# Patient Record
Sex: Male | Born: 1945 | Race: White | Hispanic: No | Marital: Married | Smoking: Never smoker
Health system: Southern US, Community
[De-identification: ages and names within clinical notes are randomized; demographics above are authoritative.]

## PROBLEM LIST (undated history)

## (undated) DIAGNOSIS — I10 Essential (primary) hypertension: Secondary | ICD-10-CM

---

## 2019-10-14 ENCOUNTER — Other Ambulatory Visit: Payer: Self-pay

## 2019-10-14 ENCOUNTER — Encounter (HOSPITAL_COMMUNITY): Payer: Self-pay | Admitting: *Deleted

## 2019-10-14 ENCOUNTER — Emergency Department (HOSPITAL_COMMUNITY)
Admission: EM | Admit: 2019-10-14 | Discharge: 2019-10-15 | Disposition: A | Discharge status: Home / Self Care | Payer: Medicare Other | Attending: Emergency Medicine | Admitting: Emergency Medicine

## 2019-10-14 DIAGNOSIS — I10 Essential (primary) hypertension: Secondary | ICD-10-CM | POA: Insufficient documentation

## 2019-10-14 DIAGNOSIS — R519 Headache, unspecified: Secondary | ICD-10-CM | POA: Insufficient documentation

## 2019-10-14 DIAGNOSIS — H5711 Ocular pain, right eye: Secondary | ICD-10-CM | POA: Diagnosis not present

## 2019-10-14 DIAGNOSIS — E039 Hypothyroidism, unspecified: Secondary | ICD-10-CM | POA: Insufficient documentation

## 2019-10-14 DIAGNOSIS — Z79899 Other long term (current) drug therapy: Secondary | ICD-10-CM | POA: Diagnosis not present

## 2019-10-14 DIAGNOSIS — R110 Nausea: Secondary | ICD-10-CM | POA: Diagnosis not present

## 2019-10-14 HISTORY — DX: Essential (primary) hypertension: I10

## 2019-10-14 NOTE — ED Triage Notes (Signed)
The pt is c/o a fb in his rt eye for the past 1-2 hours no idea what it may be  He does not wear contacts

## 2019-10-15 ENCOUNTER — Ambulatory Visit: Payer: Medicare Other | Admitting: Physical Therapy

## 2019-10-15 ENCOUNTER — Emergency Department (HOSPITAL_COMMUNITY): Payer: Medicare Other

## 2019-10-15 DIAGNOSIS — H5711 Ocular pain, right eye: Secondary | ICD-10-CM | POA: Diagnosis not present

## 2019-10-15 LAB — CBC WITH DIFFERENTIAL/PLATELET
Abs Immature Granulocytes: 0.04 10*3/uL (ref 0.00–0.07)
Basophils Absolute: 0 10*3/uL (ref 0.0–0.1)
Basophils Relative: 0 %
Eosinophils Absolute: 0 10*3/uL (ref 0.0–0.5)
Eosinophils Relative: 0 %
HCT: 45 % (ref 39.0–52.0)
Hemoglobin: 15.3 g/dL (ref 13.0–17.0)
Immature Granulocytes: 0 %
Lymphocytes Relative: 6 %
Lymphs Abs: 0.6 10*3/uL — ABNORMAL LOW (ref 0.7–4.0)
MCH: 31.7 pg (ref 26.0–34.0)
MCHC: 34 g/dL (ref 30.0–36.0)
MCV: 93.2 fL (ref 80.0–100.0)
Monocytes Absolute: 0.3 10*3/uL (ref 0.1–1.0)
Monocytes Relative: 3 %
Neutro Abs: 8 10*3/uL — ABNORMAL HIGH (ref 1.7–7.7)
Neutrophils Relative %: 91 %
Platelets: 234 10*3/uL (ref 150–400)
RBC: 4.83 MIL/uL (ref 4.22–5.81)
RDW: 12.6 % (ref 11.5–15.5)
WBC: 8.9 10*3/uL (ref 4.0–10.5)
nRBC: 0 % (ref 0.0–0.2)

## 2019-10-15 LAB — COMPREHENSIVE METABOLIC PANEL
ALT: 20 U/L (ref 0–44)
AST: 23 U/L (ref 15–41)
Albumin: 4 g/dL (ref 3.5–5.0)
Alkaline Phosphatase: 51 U/L (ref 38–126)
Anion gap: 11 (ref 5–15)
BUN: 10 mg/dL (ref 8–23)
CO2: 21 mmol/L — ABNORMAL LOW (ref 22–32)
Calcium: 9.1 mg/dL (ref 8.9–10.3)
Chloride: 103 mmol/L (ref 98–111)
Creatinine, Ser: 1.3 mg/dL — ABNORMAL HIGH (ref 0.61–1.24)
GFR calc Af Amer: >60 mL/min (ref >60)
GFR calc non Af Amer: 53 mL/min — ABNORMAL LOW (ref >60)
Glucose, Bld: 128 mg/dL — ABNORMAL HIGH (ref 70–99)
Potassium: 4.1 mmol/L (ref 3.5–5.1)
Sodium: 135 mmol/L (ref 135–145)
Total Bilirubin: 0.7 mg/dL (ref 0.3–1.2)
Total Protein: 6.7 g/dL (ref 6.5–8.1)

## 2019-10-15 MED ORDER — DIPHENHYDRAMINE HCL 50 MG/ML IJ SOLN
25.0000 mg | Freq: Once | INTRAMUSCULAR | Status: AC
  Administered 2019-10-15: 25 mg via INTRAVENOUS
  Filled 2019-10-15: qty 1

## 2019-10-15 MED ORDER — ERYTHROMYCIN 5 MG/GM OP OINT
1.0000 "application " | TOPICAL_OINTMENT | Freq: Once | OPHTHALMIC | Status: AC
  Administered 2019-10-15: 1 via OPHTHALMIC
  Filled 2019-10-15: qty 3.5

## 2019-10-15 MED ORDER — METOCLOPRAMIDE HCL 5 MG/ML IJ SOLN
10.0000 mg | Freq: Once | INTRAMUSCULAR | Status: AC
  Administered 2019-10-15: 10 mg via INTRAVENOUS
  Filled 2019-10-15: qty 2

## 2019-10-15 MED ORDER — TIMOLOL MALEATE 0.5 % OP SOLN: 1.0000 [drp] | Freq: Two times a day (BID) | OPHTHALMIC | Status: DC

## 2019-10-15 MED ORDER — FLUORESCEIN SODIUM 1 MG OP STRP
1.0000 | ORAL_STRIP | Freq: Once | OPHTHALMIC | Status: AC
  Administered 2019-10-15: 1 via OPHTHALMIC
  Filled 2019-10-15: qty 1

## 2019-10-15 MED ORDER — ACETAMINOPHEN 500 MG PO TABS
1000.0000 mg | ORAL_TABLET | Freq: Once | ORAL | Status: AC
  Administered 2019-10-15: 1000 mg via ORAL
  Filled 2019-10-15: qty 2

## 2019-10-15 MED ORDER — TIMOLOL MALEATE 0.5 % OP SOLN
1.0000 [drp] | Freq: Once | OPHTHALMIC | Status: AC
  Administered 2019-10-15: 1 [drp] via OPHTHALMIC
  Filled 2019-10-15: qty 5

## 2019-10-15 MED ORDER — TETRACAINE HCL 0.5 % OP SOLN
2.0000 [drp] | Freq: Once | OPHTHALMIC | Status: AC
  Administered 2019-10-15: 2 [drp] via OPHTHALMIC
  Filled 2019-10-15: qty 4

## 2019-10-15 NOTE — ED Provider Notes (Signed)
[facility] EMERGENCY DEPARTMENT Provider Note   CSN: [id number] Arrival date & time: 10/14/19  2210     History Chief Complaint  Patient presents with  . Eye Problem    Jason Bradshaw is a 74 y.o. male with a past medical history of HTN not taking any medications hypothyroid who presents today for evaluation of right eye pain.  He reports that at about 9 PM he was sitting watching TV and he had sudden onset of pain in his right eye with rhinorrhea.  He feels like he has a foreign body however was not doing anything at the time to have a foreign body.  He reports his vision was originally blurry however that has improved.  He reports that after about 2 hours in the ER he developed a headache and nausea.  He reports he does not get headaches frequently.   HPI     Past Medical History:  Diagnosis Date  . Hypertension     There are no problems to display for this patient.   History reviewed. No pertinent surgical history.     No family history on file.  Social History   Tobacco Use  . Smoking status: Never Smoker  . Smokeless tobacco: Never Used  Substance Use Topics  . Alcohol use: Yes  . Drug use: Not on file    Home Medications Prior to Admission medications   Medication Sig Start Date End Date Taking? Authorizing Provider  acetaminophen (TYLENOL) 500 MG tablet Take 2 tablets by mouth every 6 (six) hours as needed for pain.   Yes [provider]  azelastine (ASTELIN) 0.1 % nasal spray Place 2 sprays into both nostrils 2 (two) times daily as needed for congestion or allergies. 09/08/19  Yes [provider]  cetirizine (ZYRTEC) 10 MG tablet Take 1 tablet by mouth daily.   Yes [provider]  Eszopiclone 3 MG TABS Take 1 tablet by mouth at bedtime. 05/07/11  Yes [provider]  fluticasone (FLONASE) 50 MCG/ACT nasal spray Place 2 sprays into both nostrils daily as needed for allergies. 03/31/13  Yes [provider]  levothyroxine (SYNTHROID) 25 MCG tablet Take 1 tablet by mouth daily. 02/02/19  Yes [provider]  liothyronine (CYTOMEL) 5 MCG tablet Take 3 tablets by mouth daily. 06/24/17  Yes [provider]  mirabegron ER (MYRBETRIQ) 50 MG TB24 tablet Take 1 tablet by mouth daily as needed. 04/29/17  Yes [provider]  tamsulosin (FLOMAX) 0.4 MG CAPS capsule Take 2 capsules by mouth daily. 05/21/11  Yes [provider]    Allergies    Penicillins  Review of Systems   Review of Systems  Constitutional: Negative for chills and fever.  Eyes: Positive for pain, redness and visual disturbance. Negative for photophobia, discharge and itching.  Respiratory: Negative for cough and shortness of breath.   Gastrointestinal: Positive for nausea.  Neurological: Positive for headaches.  All other systems reviewed and are negative.   Physical Exam Updated Vital Signs BP (!) 189/93   Pulse 63   Temp 98.1 F (36.7 C)   Resp 16   Ht 5\' 11"  (1.803 m)   Wt 88 kg   SpO2 98%   BMI 27.06 kg/m   Physical Exam Vitals and nursing note reviewed.  Constitutional:      General: He is not in acute distress.    Appearance: He is well-developed. He is not diaphoretic.  HENT:     Head: Normocephalic  and atraumatic.  Eyes:     General: Lids are everted, no foreign bodies appreciated. Vision grossly intact. Gaze aligned appropriately. No scleral icterus.       Right eye: No discharge.        Left eye: No discharge.     Intraocular pressure: Measurements were taken using a handheld tonometer.    Extraocular Movements: Extraocular movements intact.     Conjunctiva/sclera: Conjunctivae normal.     Pupils: Pupils are equal, round, and reactive to light.     Comments: Mild erythema over the right upper lid.   Right eye tonopen pressures between 20-40, left eye pressures 13-15.    Cardiovascular:     Rate and Rhythm: Normal rate and regular rhythm.  Pulmonary:      Effort: Pulmonary effort is normal. No respiratory distress.     Breath sounds: No stridor.  Abdominal:     General: There is no distension.  Musculoskeletal:        General: No deformity.     Cervical back: Normal range of motion.  Skin:    General: Skin is warm and dry.  Neurological:     Mental Status: He is alert.     Motor: No abnormal muscle tone.  Psychiatric:        Behavior: Behavior normal.     ED Results / Procedures / Treatments   Labs (all labs ordered are listed, but only abnormal results are displayed) Labs Reviewed  CBC WITH DIFFERENTIAL/PLATELET - Abnormal; Notable for the following components:      Result Value   Neutro Abs 8.0 (*)    Lymphs Abs 0.6 (*)    All other components within normal limits  COMPREHENSIVE METABOLIC PANEL - Abnormal; Notable for the following components:   CO2 21 (*)    Glucose, Bld 128 (*)    Creatinine, Ser 1.30 (*)    GFR calc non Af Amer 53 (*)    All other components within normal limits    EKG EKG Interpretation  Date/Time:  Thursday October 15 2019 04:00:18 EDT Ventricular Rate:  67 PR Interval:    QRS Duration: 99 QT Interval:  414 QTC Calculation: 437 R Axis:   -23 Text Interpretation: Sinus rhythm Borderline left axis deviation Abnormal R-wave progression, early transition No previous ECGs available Confirmed by Zadie Rhine ([id number]) on 10/15/2019 4:03:15 AM   Radiology CT Head Wo Contrast  Result Date: 10/15/2019 CLINICAL DATA:  Headache, right eye pain EXAM: CT HEAD WITHOUT CONTRAST TECHNIQUE: Contiguous axial images were obtained from the base of the skull through the vertex without intravenous contrast. COMPARISON:  None. FINDINGS: Brain: No evidence of acute territorial infarction, hemorrhage, hydrocephalus,extra-axial collection or mass lesion/mass effect. There is dilatation the ventricles and sulci consistent with age-related atrophy. Low-attenuation changes in the deep white matter consistent with small  vessel ischemia. Vascular: No hyperdense vessel or unexpected calcification. Skull: The skull is intact. No fracture or focal lesion identified. Sinuses/Orbits: The visualized paranasal sinuses and mastoid air cells are clear. The orbits and globes intact. Other: None IMPRESSION: No acute intracranial abnormality. 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   On: 10/15/2019 03:48    Procedures Procedures (including critical care time)  Medications Ordered in ED Medications  fluorescein ophthalmic strip 1 strip (1 strip Both Eyes Given 10/15/19 0204)  tetracaine (PONTOCAINE) 0.5 % ophthalmic solution 2 drop (2 drops Both Eyes Given  10/15/19 0205)  metoCLOPramide (REGLAN) injection 10 mg (10 mg Intravenous Given 10/15/19 0319)  diphenhydrAMINE (BENADRYL) injection 25 mg (25 mg Intravenous Given 10/15/19 0319)  erythromycin ophthalmic ointment 1 application (1 application Right Eye Given 10/15/19 0434)  acetaminophen (TYLENOL) tablet 1,000 mg (1,000 mg Oral Given 10/15/19 0703)  timolol (TIMOPTIC) 0.5 % ophthalmic solution 1 drop (1 drop Right Eye Given 10/15/19 0703)    ED Course  I have reviewed the triage vital signs and the nursing notes.  Pertinent labs & imaging results that were available during my care of the patient were reviewed by me and considered in my medical decision making (see chart for details).  Clinical Course as of Oct 14 725  Thu Oct 15, 2019  0340 I spoke with Dr. Deatra James of ophthalmology he recommends timolol BID drops and erythromycin ointment.    [EH]    Clinical Course User Index [EH] Cristina Gong, PA-C   MDM Rules/Calculators/A&P                         Burlin Mcnair is a 74 year old man who presents today for evaluation of foreign body sensation of the right eye.  While in the waiting room he developed headache and nausea.  On exam he does not have any  fluorescein uptake in the right eye.  No foreign body in his eye pain was fully relieved with tetracaine drops.  Vision is grossly intact.  Given his headache and nausea CT head Noncon was obtained.  Given that it was within 6 hours of his headache starting if he had any SAH would have expected it to show.  He was markedly hypertensive while in the emergency room with systolic pressures in the 240s.  He is not taking any medications.  CBC is unremarkable.  CMP shows slightly elevated creatinine, no prior for comparison.  In the ER he was treated with Benadryl and Reglan for his migraine.  This resolved his nausea.  Given that he is neuro intact with a normal head CT low suspicion for serious intracranial pathology at this time.  I suspect that his hypertension is being driven by his pain, however we did discuss the importance of outpatient follow-up as he most likely has baseline high blood pressure needs to be on medications.  I spoke with Dr. Randon Goldsmith on-call for ophthalmology who recommends timolol drops twice daily, and erythromycin ointment and have patient follow-up in the office when they open in the morning.  He is aware that the pressures appear to be elevated in the right eye which is painful  The  information is given to patient.  This patient was seen  As a shared visit with Dr. Bebe Shaggy.    Return precautions were discussed with patient who states their understanding.  At the time of discharge patient denied any unaddressed complaints or concerns.  Patient is agreeable for discharge home.  Note: Portions of this report may have been transcribed using voice recognition software. Every effort was made to ensure accuracy; however, inadvertent computerized transcription errors may be present   Final Clinical Impression(s) / ED Diagnoses Final diagnoses:  Acute right eye pain  Hypertension, unspecified type    Rx / DC Orders ED Discharge Orders    None       Norman Clay 10/15/19 9767    Zadie Rhine, MD 10/16/19 [phone]

## 2019-10-15 NOTE — Discharge Instructions (Addendum)
To use your eye drops please put one drop in your right eye twice a day.    Please put a 1/2 inch strip of the eye ointment (erythromycin) in your right eye 4 times a day.

## 2019-10-15 NOTE — ED Notes (Signed)
Pt had bilateral cataract surgery long lens in right eye and short lens in left

## 2019-12-15 ENCOUNTER — Ambulatory Visit: Payer: Medicare Other | Attending: Adult Reconstructive Orthopaedic Surgery

## 2019-12-15 ENCOUNTER — Other Ambulatory Visit: Payer: Self-pay

## 2019-12-15 DIAGNOSIS — M545 Low back pain: Secondary | ICD-10-CM | POA: Diagnosis not present

## 2019-12-15 DIAGNOSIS — R2689 Other abnormalities of gait and mobility: Secondary | ICD-10-CM | POA: Diagnosis present

## 2019-12-15 DIAGNOSIS — M25551 Pain in right hip: Secondary | ICD-10-CM

## 2019-12-15 DIAGNOSIS — G8929 Other chronic pain: Secondary | ICD-10-CM | POA: Insufficient documentation

## 2019-12-15 DIAGNOSIS — R252 Cramp and spasm: Secondary | ICD-10-CM | POA: Insufficient documentation

## 2019-12-15 NOTE — Therapy (Signed)
[facility] [address] [place], [place], [address] Phone: [phone]   Fax:  [phone]  Physical Therapy Evaluation  Patient Details  Name: Jason Bradshaw MRN: [id number] Date of Birth: [birth year] Referring Provider (PT): Larry Sierras   Encounter Date: 12/15/2019   PT End of Session - 12/15/19 1213    Visit Number 1    Date for PT Re-Evaluation 02/09/20    Authorization Type Medicare    PT Start Time 1101    PT Stop Time 1138    PT Time Calculation (min) 37 min    Activity Tolerance Patient tolerated treatment well    Behavior During Therapy [facility] for tasks assessed/performed           Past Medical History:  Diagnosis Date  . Hypertension     History reviewed. No pertinent surgical history.  There were no vitals filed for this visit.    Subjective Assessment - 12/15/19 1105    Subjective Pt presents to PT with Lt sided low back pain of a chronic nature (25 year history) and onset of Rt hip pain that began 6 months ago.  MD advised pt to continue to do exercises issued after hip replacement surgery.    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 painful    How long can you walk comfortably? painful    Diagnostic tests x-ray: DDD in lumbar spine.  Rt hip x-ray was normal.    Patient Stated Goals reduce Rt hip pain    Currently in Pain? Yes    Pain Score 1    up to 4-5/10 in Rt hip.  No LBP today   Pain Location Hip   greater trochanteric bursa and radiates to the Rt knee   Pain Orientation Right    Pain Descriptors / Indicators Aching    Pain Type Chronic pain    Pain Radiating Towards Rt knee    Pain Onset More than a month ago    Pain Frequency Intermittent    Aggravating Factors  sleep at night (waking 4-5x/night)    Pain Relieving Factors gets better as the day progresses, tylenol    Effect of Pain on Daily Activities waking at night              [facility]  PT Assessment - 12/15/19 0001      Assessment   Medical Diagnosis presence of Rt artificial hip joint, Lumbago with sciatica    Referring Provider (PT) Debbora Dus, Joseph    Onset Date/Surgical Date 06/17/19   Rt LE pain, LBP is chronic    Next MD Visit none    Prior Therapy none      Precautions   Precautions None      Restrictions   Weight Bearing Restrictions No      Balance Screen   Has the patient fallen in the past 6 months Yes    How many times? 1   1 month ago- dog pulled    Has the patient had a decrease in activity level because of a fear of falling?  No    Is the patient reluctant to leave their home because of a fear of falling?  No      Home Environment   Living Environment Private residence    Living Arrangements Spouse/significant other    Type of Home Apartment    Home Access Stairs to enter;Elevator    Home Layout One level  Prior Function   Level of Independence Independent    Vocation Retired    Education officer, environmentalLeisure golf      Cognition   Overall Cognitive Status Within Functional Limits for tasks assessed      Observation/Other Assessments   Focus on Therapeutic Outcomes (FOTO)  64% limitation      Posture/Postural Control   Posture/Postural Control Postural limitations    Postural Limitations Rounded Shoulders;Forward head;Flexed trunk      ROM / Strength   AROM / PROM / Strength AROM;PROM;Strength      AROM   Overall AROM  Deficits    Overall AROM Comments Rt hip flexibility limited by 25% vs the Lt.  Lumbar A/ROM is limited by 25% without pain.      PROM   Overall PROM  Deficits    Overall PROM Comments Bil hamstrings limited by 50%.  Rt hip flexibility limited by 25% vs the Lt.        Strength   Overall Strength Deficits    Overall Strength Comments Bil knee and ankle strength is 5/5.  Rt hip abduction 4/5, flexion 4+/5, extension 4/5.  Lt hip 4+/5 throughout.      Palpation   Spinal mobility reduced PA mobility without pain    Palpation comment  No palpable tenderness in the lumbar spine today.  Rt lateral hamstring with trigger points and pain with palpation.  No tenderness over Rt trochanteric bursa.      Transfers   Transfers Independent with all Transfers      Ambulation/Gait   Ambulation/Gait Yes    Gait Pattern Antalgic                      Objective measurements completed on examination: See above findings.               PT Education - 12/15/19 1132    Education Details Access Code: [email or web address]    Person(s) Educated Patient    Methods Explanation;Demonstration;Handout    Comprehension Verbalized understanding;Returned demonstration            PT Short Term Goals - 12/15/19 1052      PT SHORT TERM GOAL #1   Title be independent in initial HEP    Time 4    Period Weeks    Status New    Target Date 01/12/20      PT SHORT TERM GOAL #2   Title reduce Rt LE pain to report waking < or = to 2-3x/night with pain    Time 4    Period Weeks    Status New    Target Date 01/12/20             PT Long Term Goals - 12/15/19 1052      PT LONG TERM GOAL #1   Title be independent in advanced HEP    Time 8    Period Weeks    Status New    Target Date 02/09/20      PT LONG TERM GOAL #2   Title reduce FOTO to < or = to 45% limitation    Time 8    Period Weeks    Status New    Target Date 02/09/20      PT LONG TERM GOAL #3   Title report a reduction in Rt LE pain to allow for waking < or = to 1x/night with pain    Time 8    Period Weeks    Status  New    Target Date 02/09/20      PT LONG TERM GOAL #4   Title improve Rt hip A/ROM to put on shoes and socks on the Rt with 70% increased ease    Time 8    Period Weeks    Status New    Target Date 02/09/20      PT LONG TERM GOAL #5   Title report a 70% reduction in Rt LE pain with standing and walking    Time 8    Period Weeks    Status New    Target Date 02/09/20      Additional Long Term Goals   Additional Long Term Goals Yes        PT LONG TERM GOAL #6   Title demonstrate symmetry with gait on level surface due to reduced Rt LE pain    Time 8    Period Weeks    Status New    Target Date 02/09/20                  Plan - 12/15/19 1147    Clinical Impression Statement Pt presents to PT with chronic LPB with 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  MD did recent x-rays and indicated that DDD is causing Rt LE pain.  Pt rates Rt hip pain as 1-4/10 with negotiating steps and putting on socks and shoes due to limited A/ROM of the Rt hip.  Pt is waking 4-5x/night with Rt LE pain.  Pt with reduced Rt hip A/ROM and flexibility vs the Lt.  Pt with localized palpable tenderness over Rt lateral hamstring.  Pt with reduced Rt hip strength and core strength throughout.  Pt will benefit from skilled PT to advance HEP for Rt hip flexibility and hip and core progression.    Personal Factors and Comorbidities Comorbidity 2    Comorbidities Rt THA, chronic LBP, time since onset,    Examination-Activity Limitations Sleep;Stairs    Examination-Participation Restrictions Shop;Meal Prep    Stability/Clinical Decision Making Stable/Uncomplicated    Clinical Decision Making Moderate    Rehab Potential Good    PT Frequency 1x / week   likely every other week   PT Duration 8 weeks    PT Treatment/Interventions ADLs/Self Care Home Management;Cryotherapy;Electrical Stimulation;Moist Heat;Iontophoresis 4mg /ml Dexamethasone;Gait training;Stair training;Functional mobility training;Therapeutic activities;Therapeutic exercise;Balance training;Neuromuscular re-education;Manual techniques;Patient/family education;Passive range of motion;Dry needling;Taping    PT Next Visit Plan review HEP, teach mobilization to Rt lateral hamstring, add TA activation and core exercises    PT Home Exercise Plan Access Code:    Consulted and Agree with Plan of Care Patient           Patient will benefit from skilled  therapeutic intervention in order to improve the following deficits and impairments:  Abnormal gait, Decreased activity tolerance, Decreased strength, Postural dysfunction, Impaired flexibility, Pain, Increased muscle spasms, Decreased endurance, Difficulty walking, Decreased range of motion  Visit Diagnosis: Chronic bilateral low back pain without sciatica - Plan: PT plan of care cert/re-cert  Cramp and spasm - Plan: PT plan of care cert/re-cert  Other abnormalities of gait and mobility - Plan: PT plan of care cert/re-cert  Pain in right hip - Plan: PT plan of care cert/re-cert     Problem List There are no problems to display for this patient.    [id number], PT 12/15/19 12:23 PM  [facility] [address],  [address] [place], [place], [address] Phone: [phone]   Fax:  [phone]  Name: Ran Tullis MRN: [id number] Date of Birth: December 09, 1945

## 2019-12-15 NOTE — Patient Instructions (Signed)
Access Code: [email or web address] URL: [email or web address] Date: 12/15/2019 Prepared by: Tresa Endo  Exercises Hooklying Single Knee to Chest Stretch - 2 x daily - 7 x weekly - 1 sets - 3 reps - 20 hold Hooklying Single Knee to Chest Stretch - 2 x daily - 7 x weekly - 1 sets - 3 reps - 20 hold Seated Hamstring Stretch - 2 x daily - 7 x weekly - 1 sets - 3 reps - 20 hold Seated Figure 4 Piriformis Stretch - 2 x daily - 7 x weekly - 1 sets - 3 reps - 20 hold Seated Transversus Abdominis Bracing - 4 x daily - 7 x weekly - 1 sets - 10 reps - 5 hold

## 2019-12-30 ENCOUNTER — Ambulatory Visit: Payer: Medicare Other | Attending: Adult Reconstructive Orthopaedic Surgery | Admitting: Physical Therapy

## 2019-12-30 ENCOUNTER — Other Ambulatory Visit: Payer: Self-pay

## 2019-12-30 ENCOUNTER — Encounter: Payer: Self-pay | Admitting: Physical Therapy

## 2019-12-30 DIAGNOSIS — M25551 Pain in right hip: Secondary | ICD-10-CM | POA: Insufficient documentation

## 2019-12-30 DIAGNOSIS — M545 Low back pain: Secondary | ICD-10-CM | POA: Diagnosis present

## 2019-12-30 DIAGNOSIS — R2689 Other abnormalities of gait and mobility: Secondary | ICD-10-CM | POA: Insufficient documentation

## 2019-12-30 DIAGNOSIS — G8929 Other chronic pain: Secondary | ICD-10-CM | POA: Insufficient documentation

## 2019-12-30 DIAGNOSIS — R252 Cramp and spasm: Secondary | ICD-10-CM | POA: Diagnosis present

## 2019-12-30 NOTE — Therapy (Signed)
[facility] [address] [place], [place], [address] Phone: [phone]   Fax:  [phone]  Physical Therapy Treatment  Patient Details  Name: Jason Bradshaw MRN: [id number] Date of Birth: Jul 01, 1945 Referring Provider (PT): Larry Sierras   Encounter Date: 12/30/2019   PT End of Session - 12/30/19 1223    Visit Number 2    Date for PT Re-Evaluation 02/09/20    Authorization Type Medicare    PT Start Time 1145    PT Stop Time 1223    PT Time Calculation (min) 38 min    Activity Tolerance Patient tolerated treatment well    Behavior During Therapy [facility] for tasks assessed/performed           Past Medical History:  Diagnosis Date  . Hypertension     History reviewed. No pertinent surgical history.  There were no vitals filed for this visit.   Subjective Assessment - 12/30/19 1148    Subjective I have been on vacation and have not been doing my exercises very much.    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 Yes    Pain Score 3     Pain Location Pelvis    Pain Orientation Lower                             [facility] Adult PT Treatment/Exercise - 12/30/19 0001      Lumbar Exercises: Stretches   Active Hamstring Stretch Right;Left;2 reps;20 seconds    Single Knee to Chest Stretch Right;Left;2 reps;20 seconds    Single Knee to Chest Stretch Limitations cross over stretch Bil 2x 20 sec     Figure 4 Stretch 2 reps;20 seconds;With overpressure      Lumbar Exercises: Aerobic   Nustep L2 x 10 min end of session      Lumbar Exercises: Supine   Ab Set --   TA contraction 6x   Glut Set 5 reps;3 seconds    Glut Set Limitations ball squeeze with glute squeeze    Clam --   unilateral 10x   Bent Knee Raise 10 reps    Bridge Limitations Attempted but could not do without cramping in hamstring                    PT Short Term Goals - 12/15/19 1052      PT SHORT TERM  GOAL #1   Title be independent in initial HEP    Time 4    Period Weeks    Status New    Target Date 01/12/20      PT SHORT TERM GOAL #2   Title reduce Rt LE pain to report waking < or = to 2-3x/night with pain    Time 4    Period Weeks    Status New    Target Date 01/12/20             PT Long Term Goals - 12/15/19 1052      PT LONG TERM GOAL #1   Title be independent in advanced HEP    Time 8    Period Weeks    Status New    Target Date 02/09/20      PT LONG TERM GOAL #2   Title reduce FOTO to < or = to 45% limitation    Time 8    Period Weeks    Status New  Target Date 02/09/20      PT LONG TERM GOAL #3   Title report a reduction in Rt LE pain to allow for waking < or = to 1x/night with pain    Time 8    Period Weeks    Status New    Target Date 02/09/20      PT LONG TERM GOAL #4   Title improve Rt hip A/ROM to put on shoes and socks on the Rt with 70% increased ease    Time 8    Period Weeks    Status New    Target Date 02/09/20      PT LONG TERM GOAL #5   Title report a 70% reduction in Rt LE pain with standing and walking    Time 8    Period Weeks    Status New    Target Date 02/09/20      Additional Long Term Goals   Additional Long Term Goals Yes      PT LONG TERM GOAL #6   Title demonstrate symmetry with gait on level surface due to reduced Rt LE pain    Time 8    Period Weeks    Status New    Target Date 02/09/20                 Plan - 12/30/19 1150    Clinical Impression Statement Pt presents with mild back pain today. he has been on vacation and not been doing his HEP very much. After a review of HEP pt did require some re-education on technique. New HEP was not given today to give pt a few days to get back into his routine at home.    Personal Factors and Comorbidities Comorbidity 2    Comorbidities Rt THA, chronic LBP, time since onset,    Examination-Activity Limitations Sleep;Stairs    Examination-Participation  Restrictions Shop;Meal Prep    Stability/Clinical Decision Making Stable/Uncomplicated    Rehab Potential Good    PT Frequency 1x / week    PT Duration 8 weeks    PT Treatment/Interventions ADLs/Self Care Home Management;Cryotherapy;Electrical Stimulation;Moist Heat;Iontophoresis 4mg /ml Dexamethasone;Gait training;Stair training;Functional mobility training;Therapeutic activities;Therapeutic exercise;Balance training;Neuromuscular re-education;Manual techniques;Patient/family education;Passive range of motion;Dry needling;Taping    PT Next Visit Plan See if pt doing his stretches daily, especially the hamstrings. Pt may be ready to add to HEP and progress his core & hip strengthening exercises.    PT Home Exercise Plan Access Code:    Consulted and Agree with Plan of Care Patient           Patient will benefit from skilled therapeutic intervention in order to improve the following deficits and impairments:  Abnormal gait, Decreased activity tolerance, Decreased strength, Postural dysfunction, Impaired flexibility, Pain, Increased muscle spasms, Decreased endurance, Difficulty walking, Decreased range of motion  Visit Diagnosis: Chronic bilateral low back pain without sciatica  Cramp and spasm  Other abnormalities of gait and mobility  Pain in right hip     Problem List There are no problems to display for this patient.   Fredick Schlosser, PTA 12/30/2019, 12:25 PM  [facility] [address] [place], [place], [place] Phone: [phone]   Fax:  [phone]  Name: Jason Bradshaw MRN: [name] Date of Birth: Sep 10, 1945

## 2020-01-06 ENCOUNTER — Other Ambulatory Visit: Payer: Self-pay

## 2020-01-06 ENCOUNTER — Ambulatory Visit: Payer: Medicare Other | Admitting: Physical Therapy

## 2020-01-06 ENCOUNTER — Encounter: Payer: Self-pay | Admitting: Physical Therapy

## 2020-01-06 DIAGNOSIS — R252 Cramp and spasm: Secondary | ICD-10-CM

## 2020-01-06 DIAGNOSIS — M25551 Pain in right hip: Secondary | ICD-10-CM

## 2020-01-06 DIAGNOSIS — R2689 Other abnormalities of gait and mobility: Secondary | ICD-10-CM

## 2020-01-06 DIAGNOSIS — M545 Low back pain, unspecified: Secondary | ICD-10-CM

## 2020-01-06 NOTE — Patient Instructions (Signed)
Access Code: [email or web address] URL: [email or web address] Date: 01/06/2020 Prepared by: Raynelle Fanning  Exercises Hooklying Single Knee to Chest Stretch - 2 x daily - 7 x weekly - 1 sets - 3 reps - 20 hold Hooklying Single Knee to Chest Stretch - 2 x daily - 7 x weekly - 1 sets - 3 reps - 20 hold Seated Hamstring Stretch - 2 x daily - 7 x weekly - 1 sets - 3 reps - 20 hold Seated Figure 4 Piriformis Stretch - 2 x daily - 7 x weekly - 1 sets - 3 reps - 20 hold Seated Transversus Abdominis Bracing - 4 x daily - 7 x weekly - 1 sets - 10 reps - 5 hold Mini Squat/Good Morning - 2 x daily - 3 x weekly - 1 sets - 10 reps Supine Active Straight Leg Raise - 1 x daily - 3 x weekly - 3 sets - 10 reps Sidelying Hip Abduction - 1 x daily - 3 x weekly - 3 sets - 10 reps Prone Hip Extension - 1 x daily - 3 x weekly - 3 sets - 10 reps

## 2020-01-06 NOTE — Therapy (Signed)
[facility] [address] [place], [place], [address] Phone: [phone]   Fax:  [phone]  Physical Therapy Treatment  Patient Details  Name: Jason Bradshaw MRN: [id number] Date of Birth: 10/18/1945 Referring Provider (PT): Larry Sierras   Encounter Date: 01/06/2020   PT End of Session - 01/06/20 0935    Visit Number 3    Date for PT Re-Evaluation 02/09/20    Authorization Type Medicare    PT Start Time 0928    PT Stop Time 1013    PT Time Calculation (min) 45 min    Activity Tolerance Patient tolerated treatment well    Behavior During Therapy [facility] for tasks assessed/performed           Past Medical History:  Diagnosis Date  . Hypertension     History reviewed. No pertinent surgical history.  There were no vitals filed for this visit.   Subjective Assessment - 01/06/20 0935    Subjective I've been fair with my HEP.    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  Rt hip x-ray was normal.    Patient Stated Goals reduce Rt hip pain    Currently in Pain? Yes    Pain Score 2     Pain Location Pelvis    Pain Orientation Lower    Pain Descriptors / Indicators Aching    Pain Type Chronic pain                             [facility] Adult PT Treatment/Exercise - 01/06/20 0001      Lumbar Exercises: Stretches   Active Hamstring Stretch Right;Left;2 reps;30 seconds    Figure 4 Stretch 2 reps;30 seconds;Seated;With overpressure      Lumbar Exercises: Aerobic   Nustep L2 x 7 min      Lumbar Exercises: Machines for Strengthening   Leg Press 50# x 20 seat 6      Lumbar Exercises: Standing   Functional Squats 20 reps    Functional Squats Limitations good mornings 2x10; also squat with bil shoulder ext 2x10 sec each; with OH lift x 10       Lumbar Exercises: Supine   Straight Leg Raise 20 reps    Straight Leg Raises Limitations bil cues to avoid  quad lag       Lumbar Exercises: Sidelying   Hip Abduction Both;20 reps      Lumbar Exercises: Prone   Straight Leg Raise 10 reps    Other Prone Lumbar Exercises pelvic press 5x5sec;                   PT Education - 01/06/20 1322    Education Details HEP progressed    Person(s) Educated Patient    Methods Explanation;Demonstration;Handout    Comprehension Verbalized understanding;Returned demonstration            PT Short Term Goals - 12/15/19 1052      PT SHORT TERM GOAL #1   Title be independent in initial HEP    Time 4    Period Weeks    Status New    Target Date 01/12/20      PT SHORT TERM GOAL #2   Title reduce Rt LE pain to report waking < or = to 2-3x/night with pain    Time 4    Period Weeks    Status New  Target Date 01/12/20             PT Long Term Goals - 12/15/19 1052      PT LONG TERM GOAL #1   Title be independent in advanced HEP    Time 8    Period Weeks    Status New    Target Date 02/09/20      PT LONG TERM GOAL #2   Title reduce FOTO to < or = to 45% limitation    Time 8    Period Weeks    Status New    Target Date 02/09/20      PT LONG TERM GOAL #3   Title report a reduction in Rt LE pain to allow for waking < or = to 1x/night with pain    Time 8    Period Weeks    Status New    Target Date 02/09/20      PT LONG TERM GOAL #4   Title improve Rt hip A/ROM to put on shoes and socks on the Rt with 70% increased ease    Time 8    Period Weeks    Status New    Target Date 02/09/20      PT LONG TERM GOAL #5   Title report a 70% reduction in Rt LE pain with standing and walking    Time 8    Period Weeks    Status New    Target Date 02/09/20      Additional Long Term Goals   Additional Long Term Goals Yes      PT LONG TERM GOAL #6   Title demonstrate symmetry with gait on level surface due to reduced Rt LE pain    Time 8    Period Weeks    Status New    Target Date 02/09/20                 Plan -  01/06/20 1319    Clinical Impression Statement Patient did well with TE today demonstrating good form with squats and low back strengthening. He reports he's doing "fair" with his HEP. Some quad lag on right with SLR. LTGs are ongoing.    Comorbidities Rt THA, chronic LBP, time since onset,    Examination-Activity Limitations Sleep;Stairs    Examination-Participation Restrictions Shop;Meal Prep    PT Frequency 1x / week    PT Duration 8 weeks    PT Treatment/Interventions ADLs/Self Care Home Management;Cryotherapy;Electrical Stimulation;Moist Heat;Iontophoresis 4mg /ml Dexamethasone;Gait training;Stair training;Functional mobility training;Therapeutic activities;Therapeutic exercise;Balance training;Neuromuscular re-education;Manual techniques;Patient/family education;Passive range of motion;Dry needling;Taping    PT Next Visit Plan Monitor compliance with HEP. Progress his core & hip strengthening exercises.    PT Home Exercise Plan Access Code:    Consulted and Agree with Plan of Care Patient           Patient will benefit from skilled therapeutic intervention in order to improve the following deficits and impairments:  Abnormal gait, Decreased activity tolerance, Decreased strength, Postural dysfunction, Impaired flexibility, Pain, Increased muscle spasms, Decreased endurance, Difficulty walking, Decreased range of motion  Visit Diagnosis: Chronic bilateral low back pain without sciatica  Cramp and spasm  Other abnormalities of gait and mobility  Pain in right hip     Problem List There are no problems to display for this patient.   [id number] Naitik Hermann PT 01/06/2020, 1:25 PM  [facility] [address], [address] [place], [place], [place] Phone: [phone]  Fax:  [phone]  Name: Jason Bradshaw MRN: [id number] Date of Birth: Sep 28, 1945

## 2020-01-14 ENCOUNTER — Other Ambulatory Visit: Payer: Self-pay

## 2020-01-14 ENCOUNTER — Ambulatory Visit: Payer: Medicare Other

## 2020-01-14 DIAGNOSIS — M25551 Pain in right hip: Secondary | ICD-10-CM

## 2020-01-14 DIAGNOSIS — R2689 Other abnormalities of gait and mobility: Secondary | ICD-10-CM

## 2020-01-14 DIAGNOSIS — R252 Cramp and spasm: Secondary | ICD-10-CM

## 2020-01-14 DIAGNOSIS — M545 Low back pain: Secondary | ICD-10-CM | POA: Diagnosis not present

## 2020-01-14 DIAGNOSIS — G8929 Other chronic pain: Secondary | ICD-10-CM

## 2020-01-14 NOTE — Therapy (Addendum)
[facility] [address], [place], [place], [address] Phone: [phone]   Fax:  [phone]  Physical Therapy Treatment  Patient Details  Name: Quentavious Rittenhouse MRN: [id number] Date of Birth: [birth year] Referring Provider (PT): Alvino Chapel   Encounter Date: 01/14/2020   PT End of Session - 01/14/20 1140    Visit Number 4    Date for PT Re-Evaluation 02/09/20    PT Start Time 1058    PT Stop Time 1140    PT Time Calculation (min) 42 min    Activity Tolerance Patient tolerated treatment well    Behavior During Therapy [facility] for tasks assessed/performed           Past Medical History:  Diagnosis Date  . Hypertension     History reviewed. No pertinent surgical history.  There were no vitals filed for this visit.   Subjective Assessment - 01/14/20 1050    Subjective I have been doing good.              [facility] PT Assessment - 01/14/20 0001      Assessment   Medical Diagnosis presence of Rt artificial hip joint, Lumbago with sciatica    Referring Provider (PT) Moskal, Broadus John      Observation/Other Assessments   Focus on Therapeutic Outcomes (FOTO)  31% limitation                         [facility] Adult PT Treatment/Exercise - 01/14/20 0001      Lumbar Exercises: Stretches   Active Hamstring Stretch Right;Left;2 reps;30 seconds    Figure 4 Stretch 2 reps;30 seconds;Seated;With overpressure      Lumbar Exercises: Aerobic   Nustep L2 x 10 min      Lumbar Exercises: Standing   Functional Squats 20 reps    Other Standing Lumbar Exercises walking in reverse: 30# with abdominal bracing x 10      Lumbar Exercises: Supine   Bridge 10 reps;5 seconds      Lumbar Exercises: Sidelying   Hip Abduction Both;20 reps      Lumbar Exercises: Prone   Straight Leg Raise 10 reps                  PT Education - 01/14/20 1124    Education Details Access Code: [email or web address]    Person(s)  Educated Patient    Methods Explanation;Demonstration    Comprehension Verbalized understanding;Returned demonstration            PT Short Term Goals - 01/14/20 1058      PT SHORT TERM GOAL #1   Title be independent in initial HEP    Status Achieved      PT SHORT TERM GOAL #2   Title reduce Rt LE pain to report waking < or = to 2-3x/night with pain    Status Achieved             PT Long Term Goals - 01/14/20 1059      PT LONG TERM GOAL #1   Title be independent in advanced HEP    Time 8    Period Weeks    Status On-going      PT LONG TERM GOAL #2   Title reduce FOTO to < or = to 45% limitation    Baseline 31% limitation    Period --    Status Achieved      PT LONG TERM GOAL #3  Title report a reduction in Rt LE pain to allow for waking < or = to 1x/night with pain    Baseline waking 1-2x/night    Time 8    Period Weeks    Status On-going      PT LONG TERM GOAL #4   Title improve Rt hip A/ROM to put on shoes and socks on the Rt with 70% increased ease    Status Partially Met      PT LONG TERM GOAL #5   Title report a 70% reduction in Rt LE pain with standing and walking    Baseline 75-80% overall reduction    Status Achieved      PT LONG TERM GOAL #6   Title demonstrate symmetry with gait on level surface due to reduced Rt LE pain    Status Achieved                 Plan - 01/14/20 1130    Clinical Impression Statement Pt reports 75-80% overall improvement in symptoms since the start of care.  He is sleeping better with waking 1 time or less each night.  Pt performed exercises and demonstrated core and hip instability and required intermittent verbal cues for abdominal bracing.  Pillow was added under abdomen with prone hip extension due to LBP with this activity.  Pt demonstrated hip weakness with this and PT advised pt to reduce repetitions to help with pain.  PT also issued bridge to work on glute strength as this was less painful for patient.  Pt  will continue to benefit from skilled PT to address strength, flexibility and functional mobility.    PT Frequency 1x / week    PT Duration 8 weeks    PT Treatment/Interventions ADLs/Self Care Home Management;Cryotherapy;Electrical Stimulation;Moist Heat;Iontophoresis 22m/ml Dexamethasone;Gait training;Stair training;Functional mobility training;Therapeutic activities;Therapeutic exercise;Balance training;Neuromuscular re-education;Manual techniques;Patient/family education;Passive range of motion;Dry needling;Taping    PT Next Visit Plan Pt will be placed on hold until 02/09/20 and return if needed.  He will work on HONEOKfor strength and flexibility gains    PT Home Exercise Plan Access Code: [email or web address]   Consulted and Agree with Plan of Care Patient           Patient will benefit from skilled therapeutic intervention in order to improve the following deficits and impairments:  Abnormal gait, Decreased activity tolerance, Decreased strength, Postural dysfunction, Impaired flexibility, Pain, Increased muscle spasms, Decreased endurance, Difficulty walking, Decreased range of motion  Visit Diagnosis: Chronic bilateral low back pain without sciatica  Cramp and spasm  Other abnormalities of gait and mobility  Pain in right hip     Problem List There are no problems to display for this patient.    KSigurd Sos PT 01/14/20 11:43 AM PHYSICAL THERAPY DISCHARGE SUMMARY  Visits from Start of Care: 4  Current functional level related to goals / functional outcomes: See above for current status.  Pt will continue with HEP.     Remaining deficits: See above for current status.     Education / Equipment: HEP, posture/body mechanics  Plan: Patient agrees to discharge.  Patient goals were met. Patient is being discharged due to meeting the stated rehab goals.  ?????        KSigurd Sos PT 02/16/20 10:49 AM  [facility] [address] [place] [address]Phone: [phone]  Fax:  [phone] Name: ECarmon SahliMRN: [id number] of Birth: 119-Feb-1947

## 2020-01-14 NOTE — Patient Instructions (Signed)
Access Code: [email or web address] URL: [email or web address] Date: 01/14/2020 Prepared by: Tresa Endo    Supine Bridge - 2 x daily - 7 x weekly - 1 sets - 10 reps - 5 hold

## 2022-04-01 IMAGING — CT CT HEAD W/O CM
4 series · 17 of 47 positions shown, 19 images · non-contrast
Comparison: None.

CLINICAL DATA: Headache, right eye pain

EXAM:
CT HEAD WITHOUT CONTRAST
TECHNIQUE: Contiguous axial images were obtained from the base of the skull
through the vertex without intravenous contrast.

[Series 3: head without · axial · non-contrast · 0.46mm/px · z∈[-85,+40]mm · 7 of 35 slices shown, 9 images]
[im 5/35  brain]
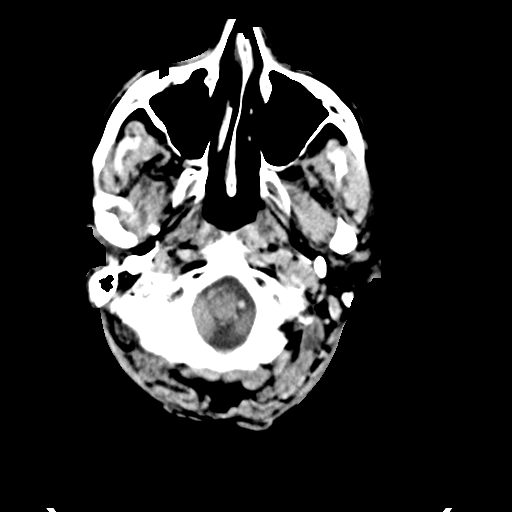
[im 5/35  bone]
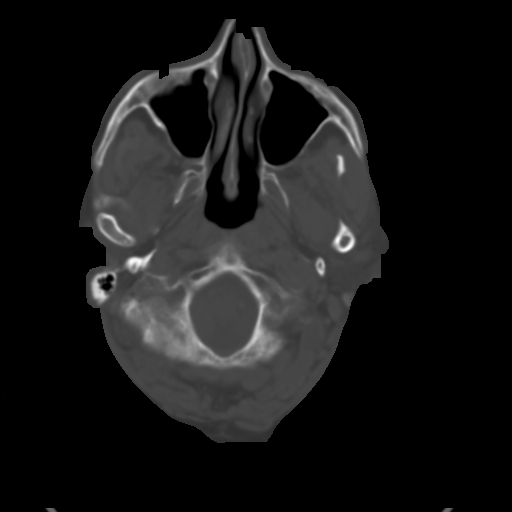
[im 9/35  brain]
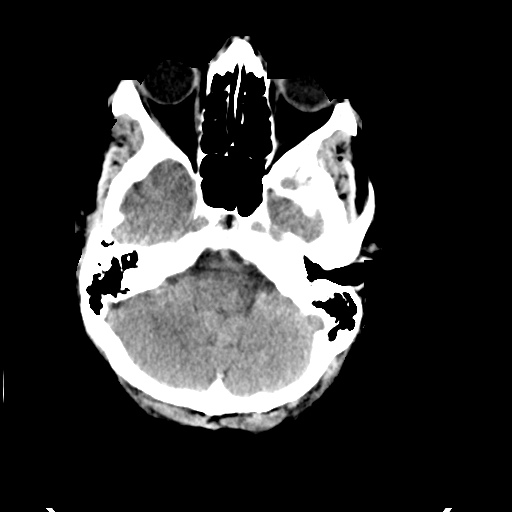
[im 13/35  brain]
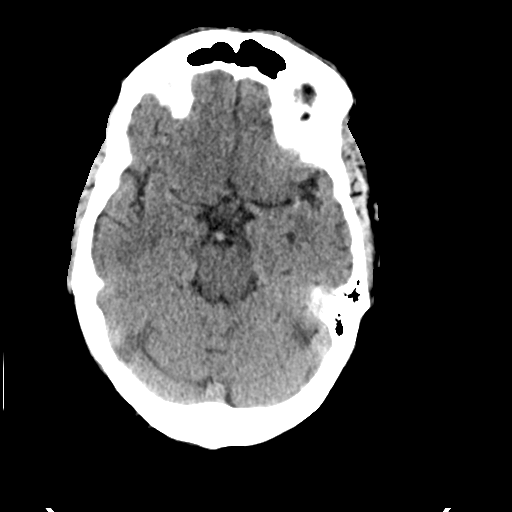
[im 18/35  brain]
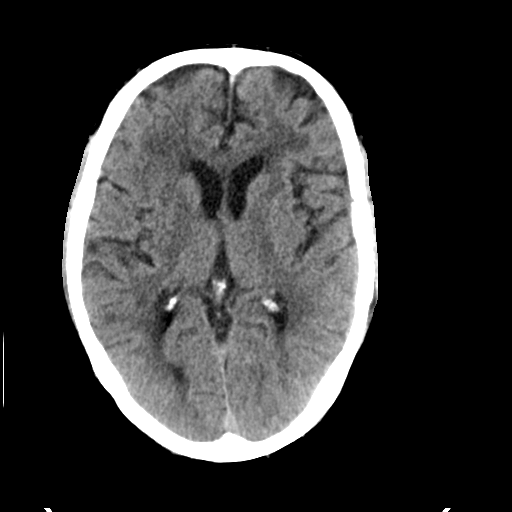
[im 22/35  brain]
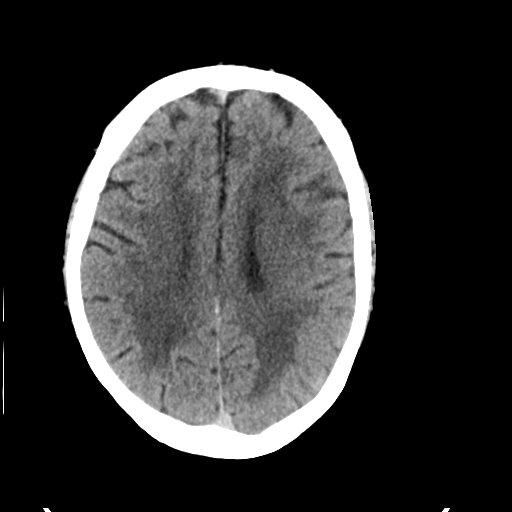
[im 22/35  bone]
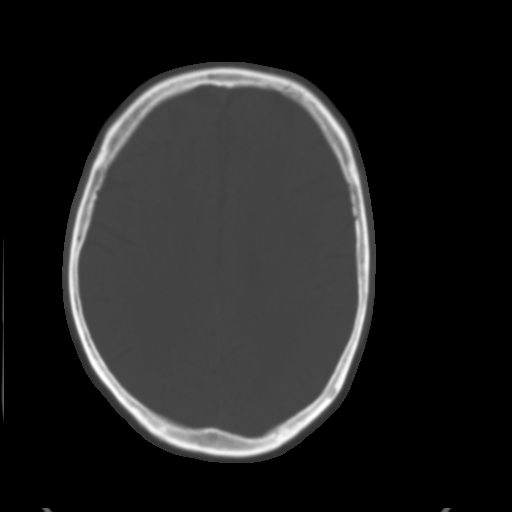
[im 26/35  brain]
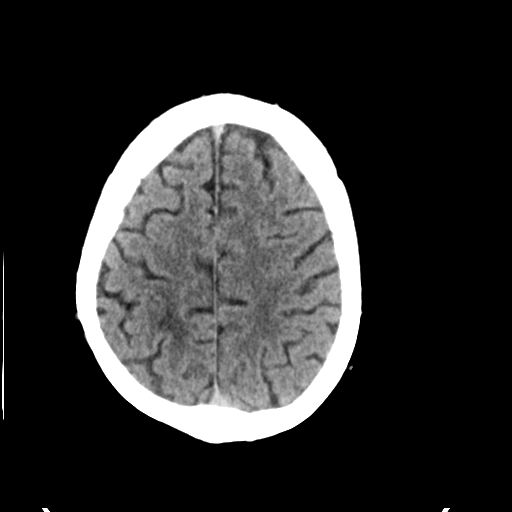
[im 30/35  brain]
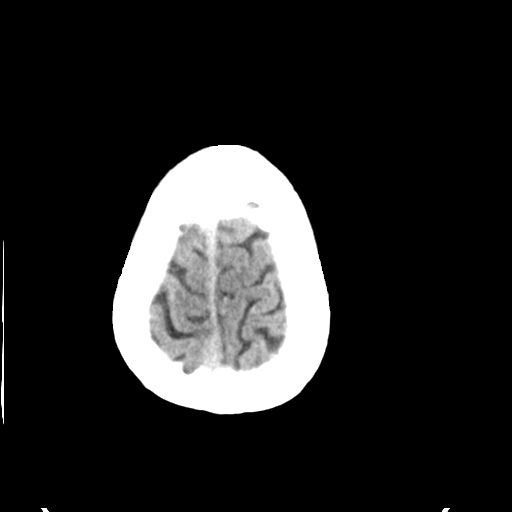

[Series 4: head bone · axial · 0.46mm/px · z∈[-89,-27]mm · 4 of 88 slices shown]
[im 9/88  bone]
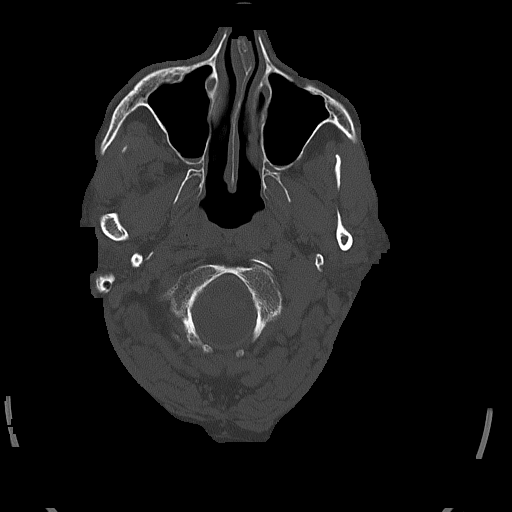
[im 18/88  bone]
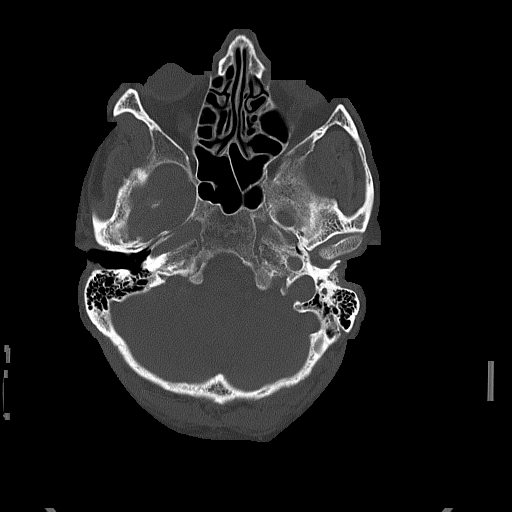
[im 27/88  bone]
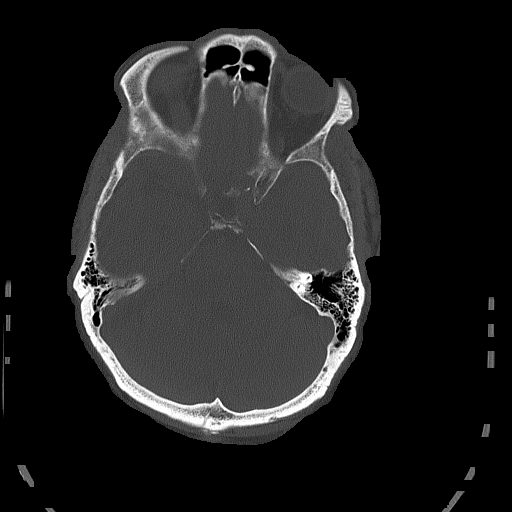
[im 40/88  bone]
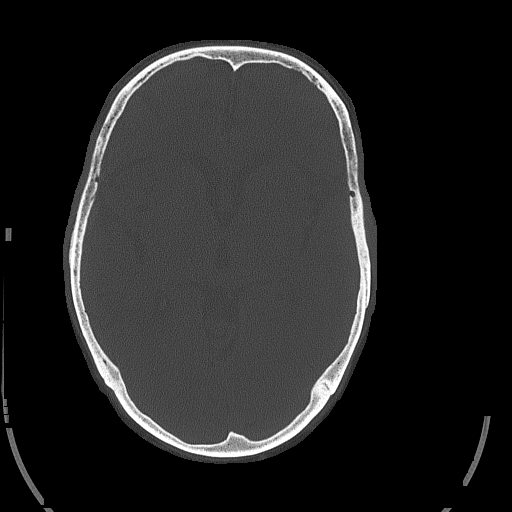

[Series 5: head without cor · coronal · non-contrast · 0.36mm/px · 3 of 73 slices shown]
[im 25/73  brain]
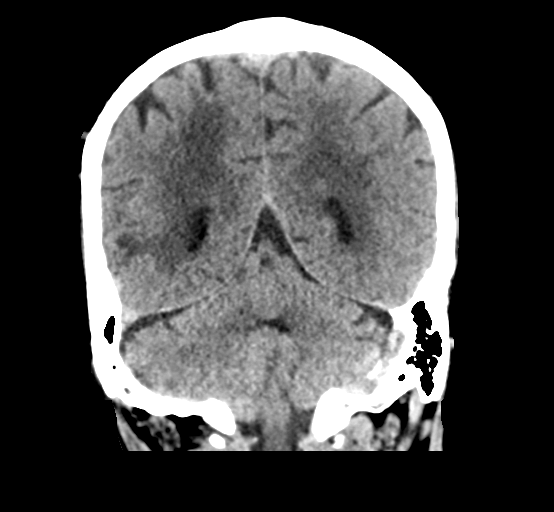
[im 33/73  brain]
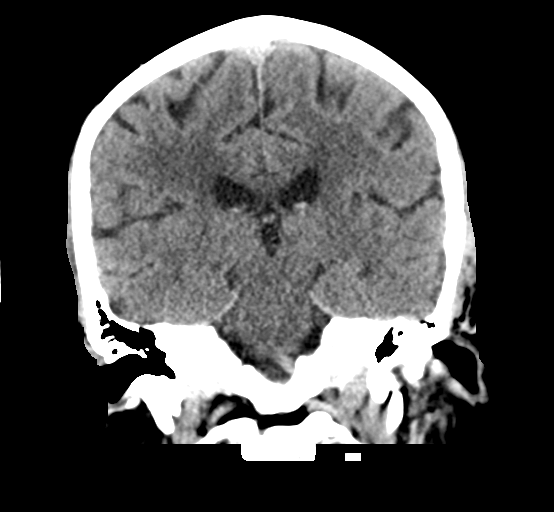
[im 41/73  brain]
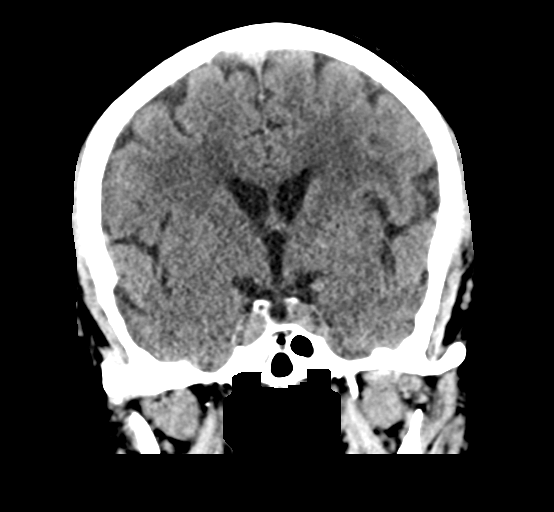

[Series 6: head without sag · sagittal · non-contrast · 0.34mm/px · 3 of 56 slices shown]
[im 19/56  brain]
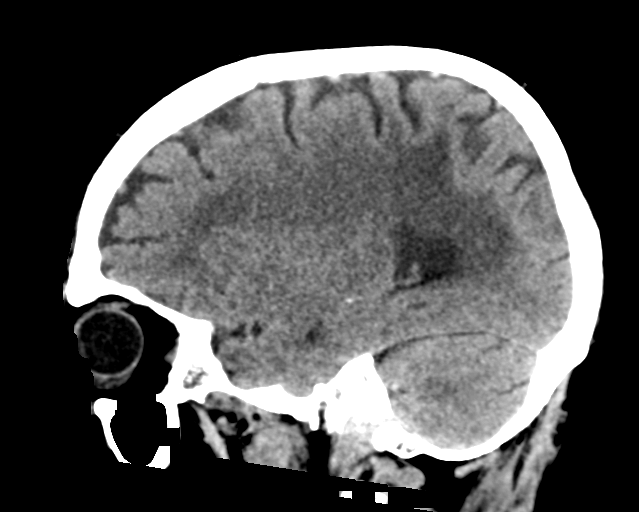
[im 28/56  brain]
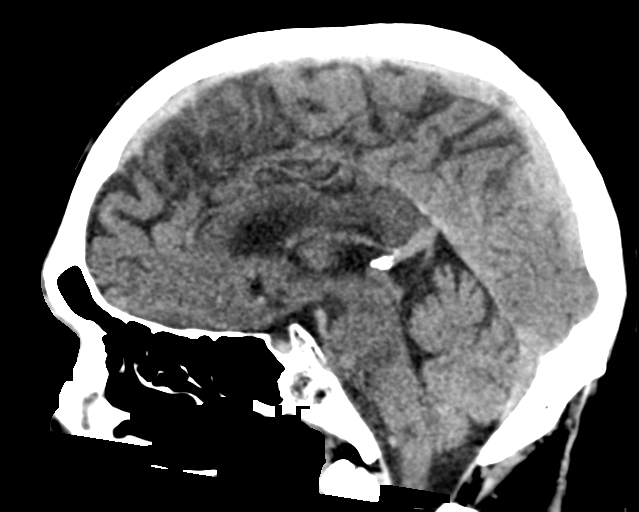
[im 37/56  brain]
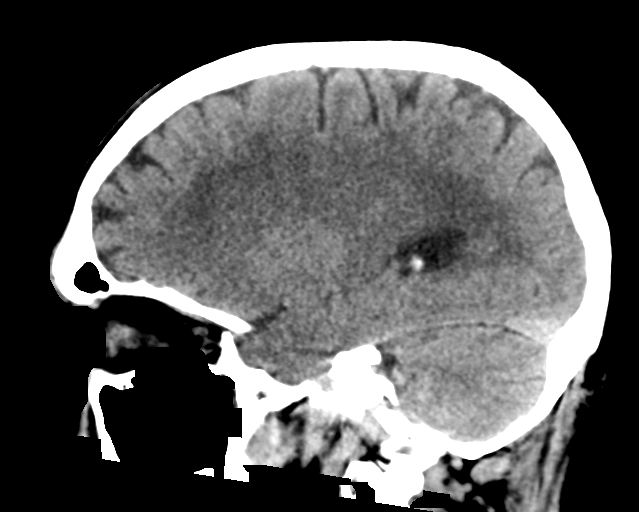

[17 of 47 positions shown; findings below may reference images not displayed]

FINDINGS: Brain: No evidence of acute territorial infarction, hemorrhage,
hydrocephalus,extra-axial collection or mass lesion/mass effect.
There is dilatation the ventricles and sulci consistent with
age-related atrophy. Low-attenuation changes in the deep white
matter consistent with small vessel ischemia.

Vascular: No hyperdense vessel or unexpected calcification.

Skull: The skull is intact. No fracture or focal lesion identified.

Sinuses/Orbits: The visualized paranasal sinuses and mastoid air
cells are clear. The orbits and globes intact.

Other: None
IMPRESSION: No acute intracranial abnormality.

Findings consistent with age related atrophy and moderate to
advanced chronic small vessel ischemia
